# Patient Record
Sex: Male | Born: 1970 | Race: Black or African American | Hispanic: Yes | Marital: Married | Smoking: Never smoker
Health system: Southern US, Community
[De-identification: ages and names within clinical notes are randomized; demographics above are authoritative.]

## PROBLEM LIST (undated history)

## (undated) DIAGNOSIS — G839 Paralytic syndrome, unspecified: Secondary | ICD-10-CM

## (undated) DIAGNOSIS — I10 Essential (primary) hypertension: Secondary | ICD-10-CM

## (undated) DIAGNOSIS — M549 Dorsalgia, unspecified: Secondary | ICD-10-CM

---

## 2013-09-01 ENCOUNTER — Other Ambulatory Visit (HOSPITAL_COMMUNITY): Payer: Self-pay | Admitting: Family Medicine

## 2013-09-01 ENCOUNTER — Ambulatory Visit (HOSPITAL_COMMUNITY)
Admission: RE | Admit: 2013-09-01 | Discharge: 2013-09-01 | Disposition: A | Discharge status: Home / Self Care | Source: Ambulatory Visit | Attending: Family Medicine | Admitting: Family Medicine

## 2013-09-01 DIAGNOSIS — R52 Pain, unspecified: Secondary | ICD-10-CM

## 2013-09-01 DIAGNOSIS — M79609 Pain in unspecified limb: Secondary | ICD-10-CM | POA: Insufficient documentation

## 2013-09-23 ENCOUNTER — Emergency Department (HOSPITAL_BASED_OUTPATIENT_CLINIC_OR_DEPARTMENT_OTHER)

## 2013-09-23 ENCOUNTER — Encounter (HOSPITAL_BASED_OUTPATIENT_CLINIC_OR_DEPARTMENT_OTHER): Payer: Self-pay | Admitting: Emergency Medicine

## 2013-09-23 ENCOUNTER — Emergency Department (HOSPITAL_BASED_OUTPATIENT_CLINIC_OR_DEPARTMENT_OTHER)
Admission: EM | Admit: 2013-09-23 | Discharge: 2013-09-23 | Disposition: A | Discharge status: Home / Self Care | Attending: Emergency Medicine | Admitting: Emergency Medicine

## 2013-09-23 DIAGNOSIS — Z79899 Other long term (current) drug therapy: Secondary | ICD-10-CM | POA: Diagnosis not present

## 2013-09-23 DIAGNOSIS — I10 Essential (primary) hypertension: Secondary | ICD-10-CM | POA: Diagnosis not present

## 2013-09-23 DIAGNOSIS — R296 Repeated falls: Secondary | ICD-10-CM | POA: Insufficient documentation

## 2013-09-23 DIAGNOSIS — W19XXXA Unspecified fall, initial encounter: Secondary | ICD-10-CM

## 2013-09-23 DIAGNOSIS — Y92009 Unspecified place in unspecified non-institutional (private) residence as the place of occurrence of the external cause: Secondary | ICD-10-CM | POA: Diagnosis not present

## 2013-09-23 DIAGNOSIS — S93409A Sprain of unspecified ligament of unspecified ankle, initial encounter: Secondary | ICD-10-CM | POA: Diagnosis not present

## 2013-09-23 DIAGNOSIS — S8990XA Unspecified injury of unspecified lower leg, initial encounter: Secondary | ICD-10-CM | POA: Insufficient documentation

## 2013-09-23 DIAGNOSIS — Y9389 Activity, other specified: Secondary | ICD-10-CM | POA: Insufficient documentation

## 2013-09-23 DIAGNOSIS — Z8669 Personal history of other diseases of the nervous system and sense organs: Secondary | ICD-10-CM | POA: Insufficient documentation

## 2013-09-23 DIAGNOSIS — S93401A Sprain of unspecified ligament of right ankle, initial encounter: Secondary | ICD-10-CM

## 2013-09-23 DIAGNOSIS — S99919A Unspecified injury of unspecified ankle, initial encounter: Secondary | ICD-10-CM

## 2013-09-23 DIAGNOSIS — S99929A Unspecified injury of unspecified foot, initial encounter: Secondary | ICD-10-CM

## 2013-09-23 HISTORY — DX: Paralytic syndrome, unspecified: G83.9

## 2013-09-23 HISTORY — DX: Dorsalgia, unspecified: M54.9

## 2013-09-23 HISTORY — DX: Essential (primary) hypertension: I10

## 2013-09-23 MED ORDER — NAPROXEN 500 MG PO TABS: 500.0000 mg | ORAL_TABLET | Freq: Two times a day (BID) | ORAL | Status: AC

## 2013-09-23 NOTE — ED Notes (Signed)
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 [facility] and hurt his right ankle and foot

## 2013-09-23 NOTE — ED Provider Notes (Signed)
CSN: [id number]     Arrival date & time 09/23/13  1459 History   First MD Initiated Contact with Patient 09/23/13 1512     Chief Complaint  Patient presents with  . Foot Pain  . Ankle Pain     (Consider location/radiation/quality/duration/timing/severity/associated sxs/prior Treatment) Patient is a 43 y.o. male presenting with lower extremity pain and ankle pain. The history is provided by the patient.  Foot Pain  Ankle Pain He states that his right leg gave out on him last night and he fell injuring his right foot and ankle. He points to the dorsal and lateral aspect of the foot and ankle as the location of pain. He rates pain 8/10. Pain is worse with weightbearing but he is able to bear weight. He denies other injury. He took 2 hydrocodone-acetaminophen tablets without relief of pain. He rates pain 8/10.  Past Medical History  Diagnosis Date  . Hypertension   . Spastic paralysis   . Back pain    History reviewed. No pertinent past surgical history. No family history on file. History  Substance Use Topics  . Smoking status: Never Smoker   . Smokeless tobacco: Not on file  . Alcohol Use: Yes     Comment: occasional    Review of Systems  All other systems reviewed and are negative.     Allergies  Review of patient's allergies indicates no known allergies.  Home Medications   Prior to Admission medications   Medication Sig Start Date End Date Taking? Authorizing Provider  baclofen (LIORESAL) 10 MG tablet Take 10 mg by mouth 3 (three) times daily.   Yes Historical Provider, MD  HYDROcodone-acetaminophen (NORCO/VICODIN) 5-325 MG per tablet Take 1 tablet by mouth every 6 (six) hours as needed for moderate pain.   Yes Historical Provider, MD  lisinopril (PRINIVIL,ZESTRIL) 40 MG tablet Take 40 mg by mouth daily.   Yes Historical Provider, MD  magnesium sulfate (EPSOM SALT) GRAN Take by mouth as needed for mild constipation.   Yes Historical Provider, MD   BP 164/101  Pulse  95  Temp(Src) 98 F (36.7 C) (Oral)  Resp 16  Wt 200 lb (90.719 kg)  SpO2 100% Physical Exam  Nursing note and vitals reviewed.  43 year old male, resting comfortably and in no acute distress. Vital signs are significant for hypertension. Oxygen saturation is 100%, which is normal. Head is normocephalic and atraumatic. PERRLA, EOMI. Oropharynx is clear. Neck is nontender and supple without adenopathy or JVD. Back is nontender and there is no CVA tenderness. Lungs are clear without rales, wheezes, or rhonchi. Chest is nontender. Heart has regular rate and rhythm without murmur. Abdomen is soft, flat, nontender without masses or hepatosplenomegaly and peristalsis is normoactive. Extremities: There is no swelling of the right foot or ankle. There is tenderness to palpation over the right fibulotalar ligament with no other area of tenderness elicited. Pain is elicited by inversion and by plantar flexion. There is no tenderness over the proximal fibula. Distal neurovascular exam is intact with strong pulses, prompt capillary refill, normal sensation. Remainder of extremity exam is normal.. Skin is warm and dry without rash. Neurologic: Mental status is normal, cranial nerves are intact, there are no motor or sensory deficits.  ED Course  Procedures (including critical care time)  Imaging Review Dg Ankle Complete Right  09/23/2013   CLINICAL DATA:  Foot and ankle pain.  EXAM: RIGHT ANKLE - COMPLETE 3+ VIEW  COMPARISON:  09/01/2013 foot radiograph.  FINDINGS: There is  no evidence of fracture, dislocation, or joint effusion. There is no evidence of arthropathy or other focal bone abnormality. Soft tissues are unremarkable.  IMPRESSION: Negative.   Electronically Signed   By: Tiburcio Pea M.D.   On: 09/23/2013 15:44   MDM   Final diagnoses:  Fall at home, initial encounter  Sprain of right ankle, initial encounter    Right ankle injury which appears to be a mild sprain. He is sent for  x-rays.  X-ray show no evidence of fracture. He is placed in an ankle splint orthotic and given crutches to use as needed and given a prescription for naproxen. His referred to sports medicine for followup.   Dione Booze, MD 09/24/13 0001

## 2013-09-23 NOTE — Discharge Instructions (Signed)
Ankle Sprain °An ankle sprain is an injury to the strong, fibrous tissues (ligaments) that hold the bones of your ankle joint together.  °CAUSES °An ankle sprain is usually caused by a fall or by twisting your ankle. Ankle sprains most commonly occur when you step on the outer edge of your foot, and your ankle turns inward. People who participate in sports are more prone to these types of injuries.  °SYMPTOMS  °· Pain in your ankle. The pain may be present at rest or only when you are trying to stand or walk. °· Swelling. °· Bruising. Bruising may develop immediately or within 1 to 2 days after your injury. °· Difficulty standing or walking, particularly when turning corners or changing directions. °DIAGNOSIS  °Your caregiver will ask you details about your injury and perform a physical exam of your ankle to determine if you have an ankle sprain. During the physical exam, your caregiver will press on and apply pressure to specific areas of your foot and ankle. Your caregiver will try to move your ankle in certain ways. An X-ray exam may be done to be sure a bone was not broken or a ligament did not separate from one of the bones in your ankle (avulsion fracture).  °TREATMENT  °Certain types of braces can help stabilize your ankle. Your caregiver can make a recommendation for this. Your caregiver may recommend the use of medicine for pain. If your sprain is severe, your caregiver may refer you to a surgeon who helps to restore function to parts of your skeletal system (orthopedist) or a physical therapist. °HOME CARE INSTRUCTIONS  °· Apply ice to your injury for 1-2 days or as directed by your caregiver. Applying ice helps to reduce inflammation and pain. °¨ Put ice in a plastic bag. °¨ Place a towel between your skin and the bag. °¨ Leave the ice on for 15-20 minutes at a time, every 2 hours while you are awake. °· Only take over-the-counter or prescription medicines for pain, discomfort, or fever as directed by  your caregiver. °· Elevate your injured ankle above the level of your heart as much as possible for 2-3 days. °· If your caregiver recommends crutches, use them as instructed. Gradually put weight on the affected ankle. Continue to use crutches or a cane until you can walk without feeling pain in your ankle. °· If you have a plaster splint, wear the splint as directed by your caregiver. Do not rest it on anything harder than a pillow for the first 24 hours. Do not put weight on it. Do not get it wet. You may take it off to take a shower or bath. °· You may have been given an elastic bandage to wear around your ankle to provide support. If the elastic bandage is too tight (you have numbness or tingling in your foot or your foot becomes cold and blue), adjust the bandage to make it comfortable. °· If you have an air splint, you may blow more air into it or let air out to make it more comfortable. You may take your splint off at night and before taking a shower or bath. Wiggle your toes in the splint several times per day to decrease swelling. °SEEK MEDICAL CARE IF:  °· You have rapidly increasing bruising or swelling. °· Your toes feel extremely cold or you lose feeling in your foot. °· Your pain is not relieved with medicine. °SEEK IMMEDIATE MEDICAL CARE IF: °· Your toes are numb or blue. °·   You have severe pain that is increasing. MAKE SURE YOU:   Understand these instructions.  Will watch your condition.  Will get help right away if you are not doing well or get worse. Document Released: 01/07/2005 Document Revised: 10/02/2011 Document Reviewed: 01/19/2011 [facility] Patient Information 2015 [employer], [place]. This information is not intended to replace advice given to you by your health care provider. Make sure you discuss any questions you have with your health care provider.  Crutch Use Crutches are used to take weight off one of your legs or feet when you stand or walk. It is important to use crutches  that fit properly. When fitted properly:  Each crutch should be 2-3 finger widths below the armpit.  Your weight should be supported by your hand, and not by resting the armpit on the crutch.  RISKS AND COMPLICATIONS Damage to the nerves that extend from your armpit to your hand and arm. To prevent this from happening, make sure your crutches fit properly and do not put pressure on your armpit when using them. HOW TO USE YOUR CRUTCHES If you have been instructed to use partial weight bearing, apply (bear) the amount of weight as your health care provider suggests. Do not bear weight in an amount that causes pain to the area of injury. Walking 1. Step with the crutches. 2. Swing the healthy leg slightly ahead of the crutches. Going Up Steps If there is no handrail: 1. Step up with the healthy leg. 2. Step up with the crutches and injured leg. 3. Continue in this way. If there is a handrail: 1. Hold both crutches in one hand. 2. Place your free hand on the handrail. 3. While putting your weight on your arms, lift your healthy leg to the step. 4. Bring the crutches and the injured leg up to that step. 5. Continue in this way. Going Down Steps Be very careful, as going down stairs with crutches is very challenging. If there is no handrail: 1. Step down with the injured leg and crutches. 2. Step down with the healthy leg. If there is a handrail: 1. Place your hand on the handrail. 2. Hold both crutches with your free hand. 3. Lower your injured leg and crutch to the step below you. Make sure to keep the crutch tips in the center of the step, never on the edge. 4. Lower your healthy leg to that step. 5. Continue in this way. Standing Up 1. Hold the injured leg forward. 2. Grab the armrest with one hand and the top of the crutches with the other hand. 3. Using these supports, pull yourself up to a standing position. Sitting Down 1. Hold the injured leg forward. 2. Grab the armrest  with one hand and the top of the crutches with the other hand. 3. Lower yourself to a sitting position. SEEK MEDICAL CARE IF:  You still feel unsteady on your feet.  You develop new pain, for example in your armpits, back, shoulder, wrist, or hip.  You develop any numbness or tingling. SEEK IMMEDIATE MEDICAL CARE IF: You fall. Document Released: 01/05/2000 Document Revised: 01/12/2013 Document Reviewed: 09/14/2012 [facility] Patient Information 2015 [place], [place]. This information is not intended to replace advice given to you by your health care provider. Make sure you discuss any questions you have with your health care provider.  Naproxen and naproxen sodium oral immediate-release tablets What is this medicine? NAPROXEN (na PROX en) is a non-steroidal anti-inflammatory drug (NSAID). It is used to reduce  swelling and to treat pain. This medicine may be used for dental pain, headache, or painful monthly periods. It is also used for painful joint and muscular problems such as arthritis, tendinitis, bursitis, and gout. This medicine may be used for other purposes; ask your health care provider or pharmacist if you have questions. COMMON BRAND NAME(S): Aflaxen, Aleve, Aleve Arthritis, All Day Relief, Anaprox, Anaprox DS, Naprosyn What should I tell my health care provider before I take this medicine? They need to know if you have any of these conditions: -asthma -cigarette smoker -drink more than 3 alcohol containing drinks a day -heart disease or circulation problems such as heart failure or leg edema (fluid retention) -high blood pressure -kidney disease -liver disease -stomach bleeding or ulcers -an unusual or allergic reaction to naproxen, aspirin, other NSAIDs, other medicines, foods, dyes, or preservatives -pregnant or trying to get pregnant -breast-feeding How should I use this medicine? Take this medicine by mouth with a glass of water. Follow the directions on the prescription  label. Take it with food if your stomach gets upset. Try to not lie down for at least 10 minutes after you take it. Take your medicine at regular intervals. Do not take your medicine more often than directed. Long-term, continuous use may increase the risk of heart attack or stroke. A special MedGuide will be given to you by the pharmacist with each prescription and refill. Be sure to read this information carefully each time. Talk to your pediatrician regarding the use of this medicine in children. Special care may be needed. Overdosage: If you think you have taken too much of this medicine contact a poison control center or emergency room at once. NOTE: This medicine is only for you. Do not share this medicine with others. What if I miss a dose? If you miss a dose, take it as soon as you can. If it is almost time for your next dose, take only that dose. Do not take double or extra doses. What may interact with this medicine? -alcohol -aspirin -cidofovir -diuretics -lithium -methotrexate -other drugs for inflammation like ketorolac or prednisone -pemetrexed -probenecid -warfarin This list may not describe all possible interactions. Give your health care provider a list of all the medicines, herbs, non-prescription drugs, or dietary supplements you use. Also tell them if you smoke, drink alcohol, or use illegal drugs. Some items may interact with your medicine. What should I watch for while using this medicine? Tell your doctor or health care professional if your pain does not get better. Talk to your doctor before taking another medicine for pain. Do not treat yourself. This medicine does not prevent heart attack or stroke. In fact, this medicine may increase the chance of a heart attack or stroke. The chance may increase with longer use of this medicine and in people who have heart disease. If you take aspirin to prevent heart attack or stroke, talk with your doctor or health care  professional. Do not take other medicines that contain aspirin, ibuprofen, or naproxen with this medicine. Side effects such as stomach upset, nausea, or ulcers may be more likely to occur. Many medicines available without a prescription should not be taken with this medicine. This medicine can cause ulcers and bleeding in the stomach and intestines at any time during treatment. Do not smoke cigarettes or drink alcohol. These increase irritation to your stomach and can make it more susceptible to damage from this medicine. Ulcers and bleeding can happen without warning symptoms and can  cause death. You may get drowsy or dizzy. Do not drive, use machinery, or do anything that needs mental alertness until you know how this medicine affects you. Do not stand or sit up quickly, especially if you are an older patient. This reduces the risk of dizzy or fainting spells. This medicine can cause you to bleed more easily. Try to avoid damage to your teeth and gums when you brush or floss your teeth. What side effects may I notice from receiving this medicine? Side effects that you should report to your doctor or health care professional as soon as possible: -black or bloody stools, blood in the urine or vomit -blurred vision -chest pain -difficulty breathing or wheezing -nausea or vomiting -severe stomach pain -skin rash, skin redness, blistering or peeling skin, hives, or itching -slurred speech or weakness on one side of the body -swelling of eyelids, throat, lips -unexplained weight gain or swelling -unusually weak or tired -yellowing of eyes or skin Side effects that usually do not require medical attention (report to your doctor or health care professional if they continue or are bothersome): -constipation -headache -heartburn This list may not describe all possible side effects. Call your doctor for medical advice about side effects. You may report side effects to FDA at 1-800-FDA-1088. Where  should I keep my medicine? Keep out of the reach of children. Store at room temperature between 15 and 30 degrees C (59 and 86 degrees F). Keep container tightly closed. Throw away any unused medicine after the expiration date. NOTE: This sheet is a summary. It may not cover all possible information. If you have questions about this medicine, talk to your doctor, pharmacist, or health care provider.  2015, [employer]. (2009-01-09 20:10:16)

## 2015-12-10 IMAGING — CR DG ANKLE COMPLETE 3+V*R*
3 series · 3 of 3 positions shown · non-contrast
Comparison: 09/01/2013 foot radiograph.

CLINICAL DATA: Foot and ankle pain.

EXAM:
RIGHT ANKLE - COMPLETE 3+ VIEW

[t ankle joint ap right]
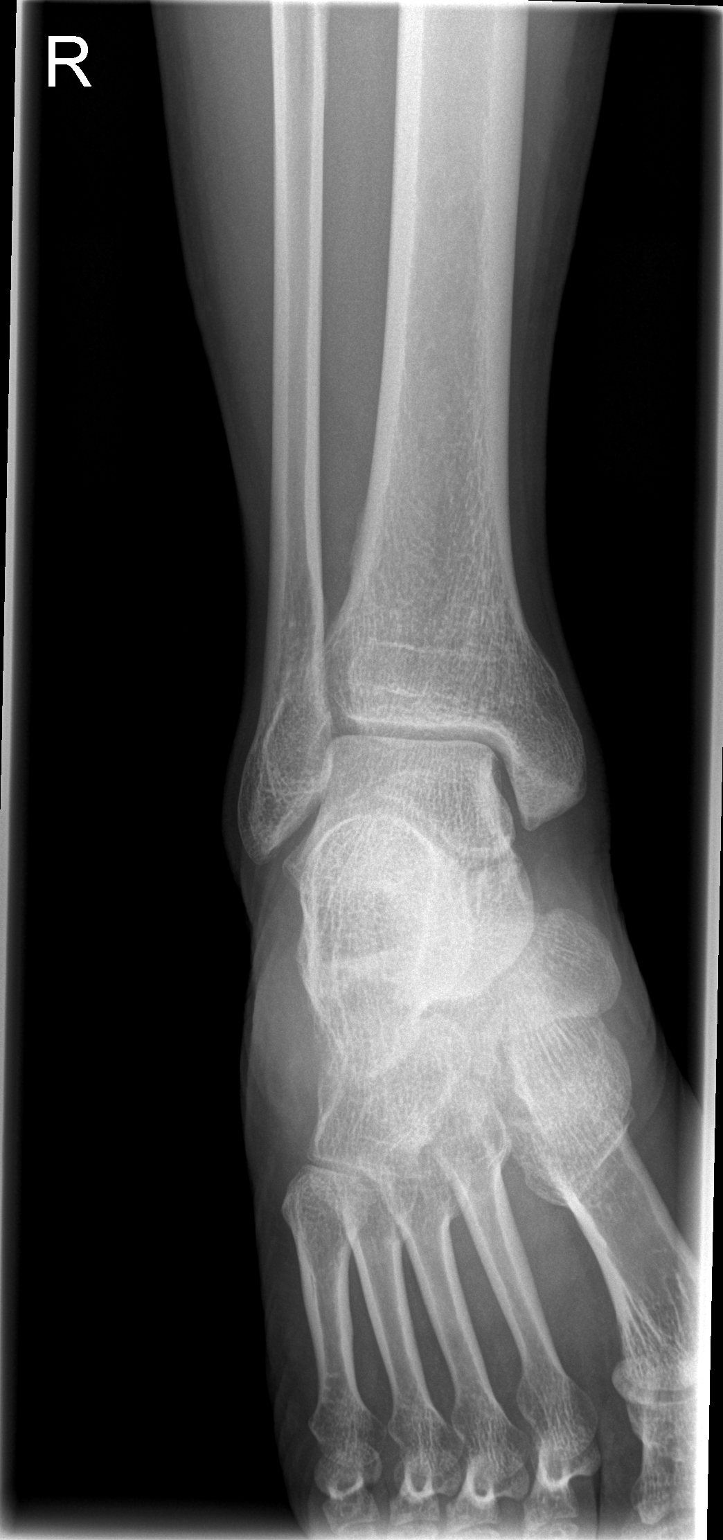

[t ankle joint oblique right]
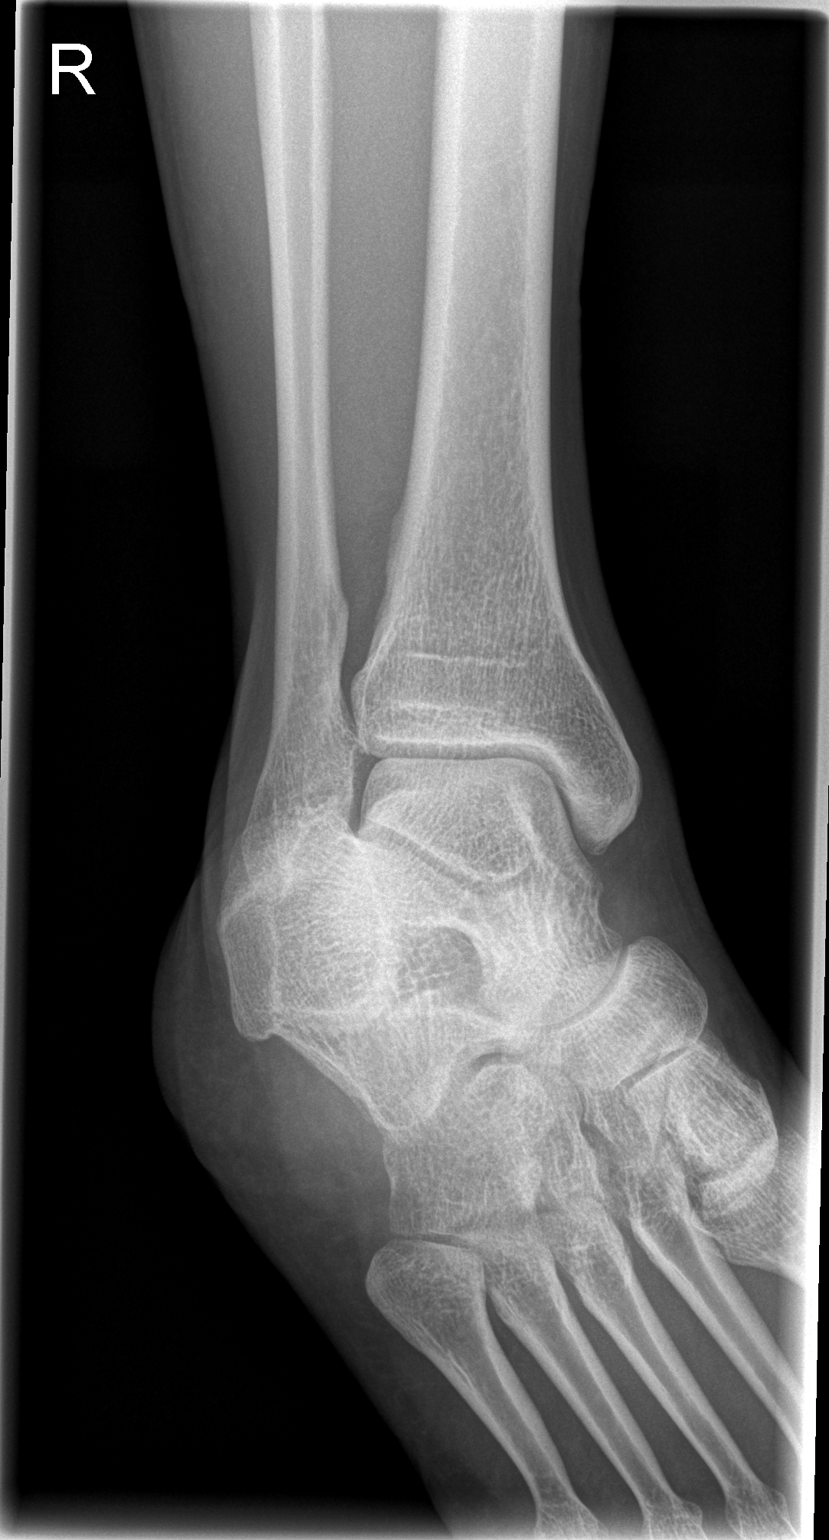

[t ankle joint lat right]
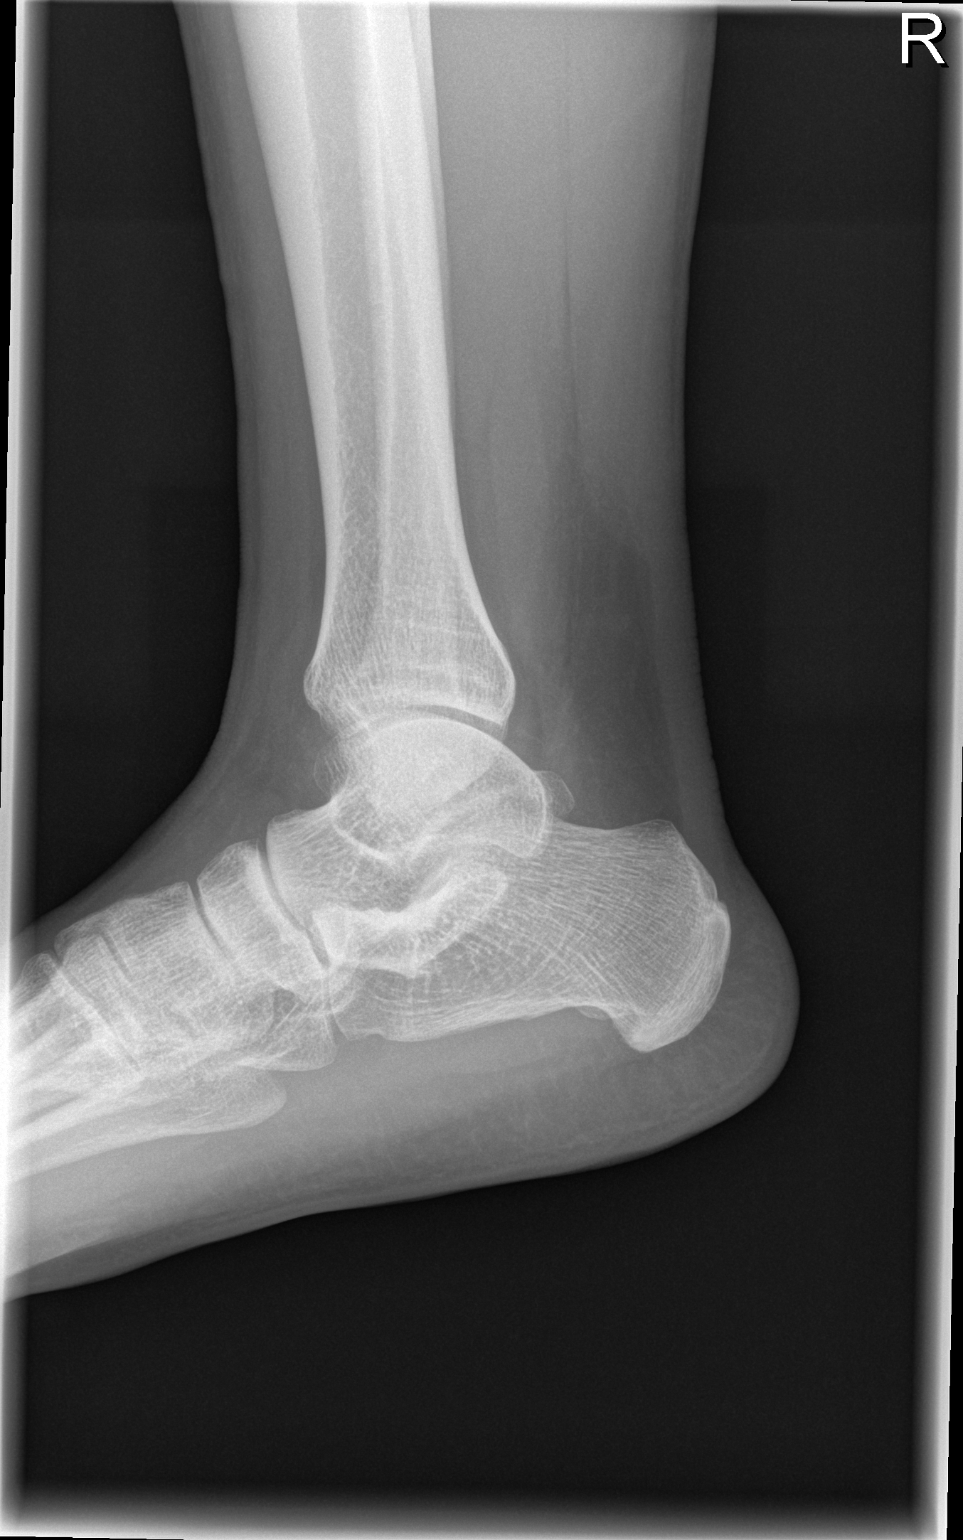

[3 of 3 positions shown; findings below may reference images not displayed]

FINDINGS: There is no evidence of fracture, dislocation, or joint effusion.
There is no evidence of arthropathy or other focal bone abnormality.
Soft tissues are unremarkable.
IMPRESSION: Negative.

## 2019-01-22 ENCOUNTER — Other Ambulatory Visit: Payer: Self-pay

## 2019-01-22 ENCOUNTER — Emergency Department (INDEPENDENT_AMBULATORY_CARE_PROVIDER_SITE_OTHER)
Admission: EM | Admit: 2019-01-22 | Discharge: 2019-01-22 | Disposition: A | Discharge status: Home / Self Care | Source: Home / Self Care | Attending: Family Medicine | Admitting: Family Medicine

## 2019-01-22 DIAGNOSIS — U071 COVID-19: Secondary | ICD-10-CM | POA: Diagnosis not present

## 2019-01-22 LAB — POC SARS CORONAVIRUS 2 AG -  ED: SARS Coronavirus 2 Ag: POSITIVE — AB

## 2019-01-22 MED ORDER — GUAIFENESIN-CODEINE 100-10 MG/5ML PO SOLN: ORAL | 0 refills | Status: AC

## 2019-01-22 NOTE — ED Provider Notes (Signed)
[facility]    CSN: [id number] Arrival date & time: 01/22/19  4388      History   Chief Complaint Chief Complaint  Patient presents with  . COVID testing symptomatic    HPI Kirk Cross is a 49 y.o. male.   Three days ago patient developed a partly productive cough followed by chills, sweats, low grade fever, fatigue, myalgias, headache, and mild dizziness.  He denies shortness of breath or pleuritic pain but becomes winded with activity.  He reports a mild change in his taste.  The history is provided by the patient.    Past Medical History:  Diagnosis Date  . Back pain   . Hypertension   . Spastic paralysis     There are no problems to display for this patient.   No past surgical history on file.     Home Medications    Prior to Admission medications   Medication Sig Start Date End Date Taking? Authorizing Provider  baclofen (LIORESAL) 10 MG tablet Take 10 mg by mouth 3 (three) times daily.    [provider]  guaiFENesin-codeine 100-10 MG/5ML syrup Take 58mL by mouth at bedtime as needed for cough.  May repeat dose in 4 to 6 hours. 01/22/19   Lattie Haw, MD  HYDROcodone-acetaminophen (NORCO/VICODIN) 5-325 MG per tablet Take 1 tablet by mouth every 6 (six) hours as needed for moderate pain.    [provider]  lisinopril (PRINIVIL,ZESTRIL) 40 MG tablet Take 40 mg by mouth daily.    [provider]  magnesium sulfate (EPSOM SALT) GRAN Take by mouth as needed for mild constipation.    [provider]  naproxen (NAPROSYN) 500 MG tablet Take 1 tablet (500 mg total) by mouth 2 (two) times daily. 09/23/13   Dione Booze, MD    Family History No family history on file.  Social History Social History   Tobacco Use  . Smoking status: Never Smoker  Substance Use Topics  . Alcohol use: Yes    Comment: occasional  . Drug use: No     Allergies   Patient has no known allergies.   Review of Systems Review of  Systems + sore throat + cough No pleuritic pain, but feels tight in anterior chest No wheezing + nasal congestion ? post-nasal drainage No sinus pain/pressure No itchy/red eyes No earache No hemoptysis No SOB + fever, + chills No nausea No vomiting No abdominal pain No diarrhea No urinary symptoms No skin rash + fatigue + myalgias + headache Used OTC meds (Tylenol) without relief   Physical Exam Triage Vital Signs ED Triage Vitals  Enc Vitals Group     BP 01/22/19 0947 (!) 156/110     Pulse Rate 01/22/19 0947 (!) 109     Resp --      Temp 01/22/19 0947 (!) 100.8 F (38.2 C)     Temp Source 01/22/19 0947 Oral     SpO2 01/22/19 0947 96 %     Weight --      Height --      Head Circumference --      Peak Flow --      Pain Score 01/22/19 0948 4     Pain Loc --      Pain Edu? --      Excl. in GC? --    No data found.  Updated Vital Signs BP (!) 156/110 (BP Location: Right Arm)   Pulse (!) 109   Temp (!) 100.8  F (38.2 C) (Oral)   SpO2 96%   Visual Acuity Right Eye Distance:   Left Eye Distance:   Bilateral Distance:    Right Eye Near:   Left Eye Near:    Bilateral Near:     Physical Exam Nursing notes and Vital Signs reviewed. Appearance:  Patient appears stated age, and in no acute distress Eyes:  Pupils are equal, round, and reactive to light and accomodation.  Extraocular movement is intact.  Conjunctivae are not inflamed  Ears:  Canals normal.  Tympanic membranes normal.  Nose:  Mildly congested turbinates.  No sinus tenderness.   Pharynx:  Normal Neck:  Supple.  No adenopathy. Lungs:  Clear to auscultation.  Breath sounds are equal.  Moving air well. Heart:  Regular rate and rhythm without murmurs, rubs, or gallops.  Abdomen:  Nontender without masses or hepatosplenomegaly.  Bowel sounds are present.  No CVA or flank tenderness.  Extremities:  No edema.  Skin:  No rash present.   UC Treatments / Results  Labs (all labs ordered are listed,  but only abnormal results are displayed) Labs Reviewed  POC SARS CORONAVIRUS 2 AG -  ED - Abnormal; Notable for the following components:      Result Value   SARS Coronavirus 2 Ag Positive (*)    All other components within normal limits    EKG   Radiology No results found.  Procedures Procedures (including critical care time)  Medications Ordered in UC Medications - No data to display  Initial Impression / Assessment and Plan / UC Course  I have reviewed the triage vital signs and the nursing notes.  Pertinent labs & imaging results that were available during my care of the patient were reviewed by me and considered in my medical decision making (see chart for details).    Benign exam.  There is no evidence of bacterial infection today.  Treat symptomatically for now  Rx for Robitussin AC for night time cough.  Controlled Substance Prescriptions I have consulted the [place] Controlled Substances Registry for this patient, and feel the risk/benefit ratio today is favorable for proceeding with this prescription for a controlled substance.    Final Clinical Impressions(s) / UC Diagnoses   Final diagnoses:  [id number] virus infection     Discharge Instructions     Take plain guaifenesin (1200mg  extended release tabs such as Mucinex) twice daily, with plenty of water, for cough and congestion.   Get adequate rest.   If needed for sinus congestion, may use Afrin nasal spray (or generic oxymetazoline) each morning for about 5 days and then discontinue.  Also recommend using saline nasal spray several times daily and saline nasal irrigation (AYR is a common brand).  Use Flonase nasal spray each morning after using Afrin nasal spray and saline nasal irrigation. Try warm salt water gargles for sore throat.  Stop all antihistamines for now, and other non-prescription cough/cold preparations. May take Ibuprofen 200mg , 4 tabs every 8 hours with food for body aches, fever, etc.   Your  COVID19 test is positive.  You are infected with the novel coronavirus and could give the virus to others.  Please continue isolation at home for at least 10 days since the start of your symptoms.  Once you complete your 10 day quarantine, you may return to normal activities as long as you've not had a fever for over 24 hours (without taking fever reducing medicine) and your symptoms are improving. Please continue good preventive care measures,  including:  frequent hand-washing, avoid touching your face, cover coughs/sneezes, stay out of crowds and keep a 6 foot distance from others.  Go to the nearest hospital emergency room if fever/cough/breathlessness are severe or illness seems like a threat to life.      ED Prescriptions    Medication Sig Dispense Auth. Provider   guaiFENesin-codeine 100-10 MG/5ML syrup Take 28mL by mouth at bedtime as needed for cough.  May repeat dose in 4 to 6 hours. 50 mL Lattie Haw, MD        Lattie Haw, MD 01/22/19 [phone]

## 2019-01-22 NOTE — ED Triage Notes (Signed)
Pt here with wife with c/o body aches, chills, headache, dry cough and sweats x2 dys. 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 Wife has been tested 4 times; neg results. Sats 96%, HR 109, 100.8 temp Rapid covid collected.

## 2019-01-22 NOTE — Discharge Instructions (Addendum)
Take plain guaifenesin (1200mg  extended release tabs such as Mucinex) twice daily, with plenty of water, for cough and congestion.   Get adequate rest.   If needed for sinus congestion, may use Afrin nasal spray (or generic oxymetazoline) each morning for about 5 days and then discontinue.  Also recommend using saline nasal spray several times daily and saline nasal irrigation (AYR is a common brand).  Use Flonase nasal spray each morning after using Afrin nasal spray and saline nasal irrigation. Try warm salt water gargles for sore throat.  Stop all antihistamines for now, and other non-prescription cough/cold preparations. May take Ibuprofen 200mg , 4 tabs every 8 hours with food for body aches, fever, etc.   Your COVID19 test is positive.  You are infected with the novel coronavirus and could give the virus to others.  Please continue isolation at home for at least 10 days since the start of your symptoms.  Once you complete your 10 day quarantine, you may return to normal activities as long as you've not had a fever for over 24 hours (without taking fever reducing medicine) and your symptoms are improving. Please continue good preventive care measures, including:  frequent hand-washing, avoid touching your face, cover coughs/sneezes, stay out of crowds and keep a 6 foot distance from others.  Go to the nearest hospital emergency room if fever/cough/breathlessness are severe or illness seems like a threat to life.
